# Patient Record
Sex: Male | Born: 1985 | Race: Black or African American | Hispanic: No | Marital: Single | Smoking: Never smoker
Health system: Southern US, Community
[De-identification: ages and names within clinical notes are randomized; demographics above are authoritative.]

## PROBLEM LIST (undated history)

## (undated) DIAGNOSIS — G809 Cerebral palsy, unspecified: Secondary | ICD-10-CM

## (undated) HISTORY — PX: DG CALCANEOUS HEEL LEFT (ARMC HX): HXRAD1465

## (undated) HISTORY — PX: HAND SURGERY: SHX662

## (undated) HISTORY — PX: SALIVARY GLAND SURGERY: SHX768

---

## 2010-04-28 ENCOUNTER — Emergency Department (HOSPITAL_COMMUNITY)
Admission: EM | Admit: 2010-04-28 | Discharge: 2010-04-28 | Disposition: A | Discharge status: Home / Self Care | Payer: Medicaid Other | Attending: Emergency Medicine | Admitting: Emergency Medicine

## 2010-04-28 DIAGNOSIS — G809 Cerebral palsy, unspecified: Secondary | ICD-10-CM | POA: Insufficient documentation

## 2010-04-28 DIAGNOSIS — L298 Other pruritus: Secondary | ICD-10-CM | POA: Insufficient documentation

## 2010-04-28 DIAGNOSIS — L2989 Other pruritus: Secondary | ICD-10-CM | POA: Insufficient documentation

## 2010-04-28 DIAGNOSIS — R21 Rash and other nonspecific skin eruption: Secondary | ICD-10-CM | POA: Insufficient documentation

## 2010-04-28 DIAGNOSIS — T7840XA Allergy, unspecified, initial encounter: Secondary | ICD-10-CM | POA: Insufficient documentation

## 2010-04-28 DIAGNOSIS — L509 Urticaria, unspecified: Secondary | ICD-10-CM | POA: Insufficient documentation

## 2010-04-28 DIAGNOSIS — I10 Essential (primary) hypertension: Secondary | ICD-10-CM | POA: Insufficient documentation

## 2011-08-22 ENCOUNTER — Other Ambulatory Visit: Payer: Self-pay | Admitting: Specialist

## 2011-08-22 DIAGNOSIS — R110 Nausea: Secondary | ICD-10-CM

## 2011-08-22 DIAGNOSIS — R1011 Right upper quadrant pain: Secondary | ICD-10-CM

## 2011-08-25 ENCOUNTER — Ambulatory Visit
Admission: RE | Admit: 2011-08-25 | Discharge: 2011-08-25 | Disposition: A | Discharge status: Home / Self Care | Payer: Medicaid Other | Source: Ambulatory Visit | Attending: Specialist | Admitting: Specialist

## 2011-08-25 DIAGNOSIS — R110 Nausea: Secondary | ICD-10-CM

## 2011-08-25 DIAGNOSIS — R1011 Right upper quadrant pain: Secondary | ICD-10-CM

## 2014-06-05 ENCOUNTER — Emergency Department (HOSPITAL_COMMUNITY)
Admission: EM | Admit: 2014-06-05 | Discharge: 2014-06-05 | Disposition: A | Discharge status: Home / Self Care | Payer: Medicaid Other | Source: Home / Self Care

## 2014-06-05 ENCOUNTER — Encounter (HOSPITAL_COMMUNITY): Payer: Self-pay | Admitting: Emergency Medicine

## 2014-06-05 DIAGNOSIS — G809 Cerebral palsy, unspecified: Secondary | ICD-10-CM

## 2014-06-05 DIAGNOSIS — J111 Influenza due to unidentified influenza virus with other respiratory manifestations: Secondary | ICD-10-CM

## 2014-06-05 DIAGNOSIS — R05 Cough: Secondary | ICD-10-CM

## 2014-06-05 DIAGNOSIS — R059 Cough, unspecified: Secondary | ICD-10-CM

## 2014-06-05 HISTORY — DX: Cerebral palsy, unspecified: G80.9

## 2014-06-05 MED ORDER — BENZONATATE 100 MG PO CAPS: 100.0000 mg | ORAL_CAPSULE | Freq: Three times a day (TID) | ORAL | Status: DC | PRN

## 2014-06-05 NOTE — ED Notes (Signed)
C/o cold sx onset 3 days Sx include HA, chills, fevers and diarrhea Taking Sudafed w/no relief Alert, no signs of acute distress.

## 2014-06-05 NOTE — Discharge Instructions (Signed)
You have the flu. This is improving. Please use the tessalon for your cough Please use mucinex for the mucus. Please come back if you get worse.

## 2014-06-05 NOTE — ED Provider Notes (Signed)
CSN: [id number]     Arrival date & time 06/05/14  1045 History   None    Chief Complaint  Patient presents with  . URI   (Consider location/radiation/quality/duration/timing/severity/associated sxs/prior Treatment) HPI   3 days ago developed HA, cough. Sudafed w/o benefit. Assocated w/ body aches chills, subjective fevers, diarrhea. Symptoms are constant but improving overall. Developed green mucus yesterday. Denies abd pain, nausea, back pain, dysuria, AMS, syncope. Worse at night.   Past Medical History  Diagnosis Date  . Cerebral palsy    History reviewed. No pertinent past surgical history. Family History  Problem Relation Age of Onset  . Cancer Neg Hx   . Hypertension Neg Hx   . Hyperlipidemia Neg Hx   . Heart failure Neg Hx    History  Substance Use Topics  . Smoking status: Never Smoker   . Smokeless tobacco: Not on file  . Alcohol Use: No    Review of Systems Per HPI with all other pertinent systems negative.   Allergies  Review of patient's allergies indicates no known allergies.  Home Medications   Prior to Admission medications   Medication Sig Start Date End Date Taking? Authorizing Provider  benzonatate (TESSALON PERLES) 100 MG capsule Take 1-2 capsules (100-200 mg total) by mouth 3 (three) times daily as needed for cough. 06/05/14   Waldemar Dickens, MD   BP 113/66 mmHg  Pulse 84  Temp(Src) 98.5 F (36.9 C) (Oral)  Resp 14  SpO2 97% Physical Exam Physical Exam  Constitutional: oriented to person, place, and time. appears well-developed and well-nourished. No distress.  HENT:  Head: Normocephalic and atraumatic.  Eyes: EOMI. PERRL.  Neck: Normal range of motion.  Cardiovascular: RRR, no m/r/g, 2+ distal pulses,  Pulmonary/Chest: Effort normal and breath sounds normal. No respiratory distress.  Abdominal: Soft. Bowel sounds are normal. NonTTP, no distension.  Musculoskeletal: Normal range of motion. Non ttp, no effusion.  Neurological: alert and  oriented to person, place, and time.  Skin: Skin is warm. No rash noted. non diaphoretic.  Psychiatric: normal mood and affect. behavior is normal. Judgment and thought content normal.   ED Course  Procedures (including critical care time) Labs Review Labs Reviewed - No data to display  Imaging Review No results found.   MDM   1. Flu   2. Cough   3. Cerebral palsy    Flu symptoms improving. Continue IV Profen and Tylenol as needed for symptom relief. No signs of dehydration or respiratory decompensation. Patient with cerebral palsy but very high functioning. Patient to start Mucinex for mucus secretions and cough. Start Tessalon Perles for lingering nagging cough. No need for antibiotics at this point in time and patient outside the window for Tamiflu.   Precautions given and all questions answered  Linna Darner, MD Family Medicine 06/05/2014, 12:02 PM      Waldemar Dickens, MD 06/05/14 [phone]

## 2015-05-14 ENCOUNTER — Emergency Department (HOSPITAL_COMMUNITY): Payer: Medicaid Other

## 2015-05-14 ENCOUNTER — Emergency Department (HOSPITAL_COMMUNITY)
Admission: EM | Admit: 2015-05-14 | Discharge: 2015-05-15 | Disposition: A | Discharge status: Home / Self Care | Payer: Medicaid Other | Attending: Emergency Medicine | Admitting: Emergency Medicine

## 2015-05-14 ENCOUNTER — Encounter (HOSPITAL_COMMUNITY): Payer: Self-pay | Admitting: *Deleted

## 2015-05-14 DIAGNOSIS — Z8669 Personal history of other diseases of the nervous system and sense organs: Secondary | ICD-10-CM | POA: Diagnosis not present

## 2015-05-14 DIAGNOSIS — R109 Unspecified abdominal pain: Secondary | ICD-10-CM | POA: Insufficient documentation

## 2015-05-14 LAB — URINALYSIS, ROUTINE W REFLEX MICROSCOPIC
BILIRUBIN URINE: NEGATIVE
Glucose, UA: NEGATIVE mg/dL
Hgb urine dipstick: NEGATIVE
KETONES UR: NEGATIVE mg/dL
LEUKOCYTES UA: NEGATIVE
NITRITE: NEGATIVE
PH: 5.5 (ref 5.0–8.0)
PROTEIN: NEGATIVE mg/dL
Specific Gravity, Urine: 1.021 (ref 1.005–1.030)

## 2015-05-14 MED ORDER — ONDANSETRON HCL 4 MG/2ML IJ SOLN
4.0000 mg | Freq: Once | INTRAMUSCULAR | Status: AC
  Administered 2015-05-15: 4 mg via INTRAVENOUS
  Filled 2015-05-14: qty 2

## 2015-05-14 MED ORDER — MORPHINE SULFATE (PF) 4 MG/ML IV SOLN
4.0000 mg | Freq: Once | INTRAVENOUS | Status: AC
  Administered 2015-05-15: 4 mg via INTRAVENOUS
  Filled 2015-05-14: qty 1

## 2015-05-14 NOTE — ED Notes (Signed)
Pt c/o right side pain x 2 hours. Denies nausea, denies urinary symptoms.

## 2015-05-14 NOTE — ED Notes (Signed)
Pt states he took some ibuprofen at 8p which helped.

## 2015-05-14 NOTE — ED Notes (Signed)
Pt called for triage but no answer in lobby

## 2015-05-14 NOTE — ED Provider Notes (Signed)
CSN: [id number]     Arrival date & time 05/14/15  2158 History   First MD Initiated Contact with Patient 05/14/15 2329     Chief Complaint  Patient presents with  . Flank Pain     (Consider location/radiation/quality/duration/timing/severity/associated sxs/prior Treatment) HPI Comments: Patient presents to the emergency department with chief complaint of right flank pain. He states the pain came on suddenly this evening. States that it was sharp in nature. States that the pain was severe, but it has eased off now after taking some ibuprofen. He denies any dysuria. Denies any trauma or injury. He denies fevers or cough. There are no other modifying factors.  The history is provided by the patient. No language interpreter was used.    Past Medical History  Diagnosis Date  . Cerebral palsy ([place])    History reviewed. No pertinent past surgical history. Family History  Problem Relation Age of Onset  . Cancer Neg Hx   . Hypertension Neg Hx   . Hyperlipidemia Neg Hx   . Heart failure Neg Hx    Social History  Substance Use Topics  . Smoking status: Never Smoker   . Smokeless tobacco: None  . Alcohol Use: No    Review of Systems  All other systems reviewed and are negative.     Allergies  Review of patient's allergies indicates no known allergies.  Home Medications   Prior to Admission medications   Medication Sig Start Date End Date Taking? Authorizing Provider  benzonatate (TESSALON PERLES) 100 MG capsule Take 1-2 capsules (100-200 mg total) by mouth 3 (three) times daily as needed for cough. 06/05/14   Waldemar Dickens, MD   BP 158/101 mmHg  Pulse 69  Temp(Src) 97.4 F (36.3 C)  Resp 18  SpO2 98% Physical Exam  Constitutional: He is oriented to person, place, and time. He appears well-developed and well-nourished.  HENT:  Head: Normocephalic and atraumatic.  Eyes: Conjunctivae and EOM are normal. Pupils are equal, round, and reactive to light. Right eye exhibits no  discharge. Left eye exhibits no discharge. No scleral icterus.  Neck: Normal range of motion. Neck supple. No JVD present.  Cardiovascular: Normal rate, regular rhythm and normal heart sounds.  Exam reveals no gallop and no friction rub.   No murmur heard. Pulmonary/Chest: Effort normal and breath sounds normal. No respiratory distress. He has no wheezes. He has no rales. He exhibits no tenderness.  Abdominal: Soft. He exhibits no distension and no mass. There is no tenderness. There is no rebound and no guarding.  No focal abdominal tenderness, no RLQ tenderness or pain at McBurney's point, no RUQ tenderness or Murphy's sign, no left-sided abdominal tenderness, no fluid wave, or signs of peritonitis   Musculoskeletal: Normal range of motion. He exhibits no edema or tenderness.  Neurological: He is alert and oriented to person, place, and time.  Skin: Skin is warm and dry.  Psychiatric: He has a normal mood and affect. His behavior is normal. Judgment and thought content normal.  Nursing note and vitals reviewed.   ED Course  Procedures (including critical care time) Results for orders placed or performed during the hospital encounter of 05/14/15  Urinalysis, Routine w reflex microscopic-may I&O cath if menses (not at [facility])  Result Value Ref Range   Color, Urine YELLOW YELLOW   APPearance CLEAR CLEAR   Specific Gravity, Urine 1.021 1.005 - 1.030   pH 5.5 5.0 - 8.0   Glucose, UA NEGATIVE NEGATIVE mg/dL   Hgb  urine dipstick NEGATIVE NEGATIVE   Bilirubin Urine NEGATIVE NEGATIVE   Ketones, ur NEGATIVE NEGATIVE mg/dL   Protein, ur NEGATIVE NEGATIVE mg/dL   Nitrite NEGATIVE NEGATIVE   Leukocytes, UA NEGATIVE NEGATIVE  CBC  Result Value Ref Range   WBC 5.2 4.0 - 10.5 K/uL   RBC 5.04 4.22 - 5.81 MIL/uL   Hemoglobin 14.4 13.0 - 17.0 g/dL   HCT 42.1 39.0 - 52.0 %   MCV 83.5 78.0 - 100.0 fL   MCH 28.6 26.0 - 34.0 pg   MCHC 34.2 30.0 - 36.0 g/dL   RDW 12.5 11.5 - 15.5 %   Platelets 187  150 - 400 K/uL  Comprehensive metabolic panel  Result Value Ref Range   Sodium 141 135 - 145 mmol/L   Potassium 3.8 3.5 - 5.1 mmol/L   Chloride 104 101 - 111 mmol/L   CO2 24 22 - 32 mmol/L   Glucose, Bld 96 65 - 99 mg/dL   BUN 9 6 - 20 mg/dL   Creatinine, Ser 1.03 0.61 - 1.24 mg/dL   Calcium 9.6 8.9 - 10.3 mg/dL   Total Protein 7.3 6.5 - 8.1 g/dL   Albumin 4.0 3.5 - 5.0 g/dL   AST 29 15 - 41 U/L   ALT 32 17 - 63 U/L   Alkaline Phosphatase 64 38 - 126 U/L   Total Bilirubin 0.8 0.3 - 1.2 mg/dL   GFR calc non Af Amer >60 >60 mL/min   GFR calc Af Amer >60 >60 mL/min   Anion gap 13 5 - 15   Ct Abdomen Pelvis Wo Contrast  05/15/2015  CLINICAL DATA:  RIGHT flank pain for 2 hours. History of cerebral palsy. EXAM: CT ABDOMEN AND PELVIS WITHOUT CONTRAST TECHNIQUE: Multidetector CT imaging of the abdomen and pelvis was performed following the standard protocol without IV contrast. COMPARISON:  Abdominal ultrasound August 25, 2011. FINDINGS: LUNG BASES: Included view of the lung bases are clear. The visualized heart and pericardium are unremarkable. KIDNEYS/BLADDER: Kidneys are orthotopic, demonstrating normal size and morphology. No nephrolithiasis, hydronephrosis; limited assessment for renal masses on this nonenhanced examination. The unopacified ureters are normal in course and caliber. Urinary bladder is partially distended and unremarkable. SOLID ORGANS: The liver, spleen, gallbladder, pancreas and adrenal glands are unremarkable for this non-contrast examination. GASTROINTESTINAL TRACT: Very small hiatal hernia. The stomach, small and large bowel are normal in course and caliber without inflammatory changes, the sensitivity may be decreased by lack of enteric contrast. Mild suspected sigmoid diverticulosis. Normal appendix. PERITONEUM/RETROPERITONEUM: Aortoiliac vessels are normal in course and caliber. No lymphadenopathy by CT size criteria. Small scattered lymph nodes are likely reactive. Prostate  size is normal. No intraperitoneal free fluid nor free air. SOFT TISSUES/ OSSEOUS STRUCTURES:  Nonsuspicious. IMPRESSION: No urolithiasis, obstructive uropathy nor acute intra-abdominal/pelvic process. Normal appendix. Electronically Signed   By: Elon Alas M.D.   On: 05/15/2015 00:37    I have personally reviewed and evaluated these images and lab results as part of my medical decision-making.    MDM   Final diagnoses:  Abdominal pain, unspecified abdominal location    Patient with moderate to severe right-sided flank pain. Will check CT for stone. We'll treat pain, and will reassess.  Labs and CT are normal. No further emergent workup indicated. Recommend primary care follow-up. Patient is stable and ready for discharge. Return precautions given.  Patient instructed to return for:  New or worsening symptoms, including, increased abdominal pain, especially pain that localizes to one side, bloody  vomit, bloody diarrhea, fever >101, and intractable vomiting.     Montine Circle, PA-C 05/15/15 0106  Leonard Schwartz, MD 05/17/15 1346

## 2015-05-15 LAB — CBC
HEMATOCRIT: 42.1 % (ref 39.0–52.0)
Hemoglobin: 14.4 g/dL (ref 13.0–17.0)
MCH: 28.6 pg (ref 26.0–34.0)
MCHC: 34.2 g/dL (ref 30.0–36.0)
MCV: 83.5 fL (ref 78.0–100.0)
PLATELETS: 187 10*3/uL (ref 150–400)
RBC: 5.04 MIL/uL (ref 4.22–5.81)
RDW: 12.5 % (ref 11.5–15.5)
WBC: 5.2 10*3/uL (ref 4.0–10.5)

## 2015-05-15 LAB — COMPREHENSIVE METABOLIC PANEL
ALBUMIN: 4 g/dL (ref 3.5–5.0)
ALT: 32 U/L (ref 17–63)
AST: 29 U/L (ref 15–41)
Alkaline Phosphatase: 64 U/L (ref 38–126)
Anion gap: 13 (ref 5–15)
BUN: 9 mg/dL (ref 6–20)
CHLORIDE: 104 mmol/L (ref 101–111)
CO2: 24 mmol/L (ref 22–32)
CREATININE: 1.03 mg/dL (ref 0.61–1.24)
Calcium: 9.6 mg/dL (ref 8.9–10.3)
GFR calc Af Amer: >60 mL/min (ref >60)
GLUCOSE: 96 mg/dL (ref 65–99)
Potassium: 3.8 mmol/L (ref 3.5–5.1)
Sodium: 141 mmol/L (ref 135–145)
Total Bilirubin: 0.8 mg/dL (ref 0.3–1.2)
Total Protein: 7.3 g/dL (ref 6.5–8.1)

## 2015-05-15 NOTE — Discharge Instructions (Signed)
Flank Pain Flank pain refers to pain that is located on the side of the body between the upper abdomen and the back. The pain may occur over a short period of time (acute) or may be long-term or reoccurring (chronic). It may be mild or severe. Flank pain can be caused by many things. CAUSES  Some of the more common causes of flank pain include:  Muscle strains.   Muscle spasms.   A disease of your spine (vertebral disk disease).   A lung infection (pneumonia).   Fluid around your lungs (pulmonary edema).   A kidney infection.   Kidney stones.   A very painful skin rash caused by the chickenpox virus (shingles).   Gallbladder disease.  [place] care will depend on the cause of your pain. In general,  Rest as directed by your caregiver.  Drink enough fluids to keep your urine clear or pale yellow.  Only take over-the-counter or prescription medicines as directed by your caregiver. Some medicines may help relieve the pain.  Tell your caregiver about any changes in your pain.  Follow up with your caregiver as directed. SEEK IMMEDIATE MEDICAL CARE IF:   Your pain is not controlled with medicine.   You have new or worsening symptoms.  Your pain increases.   You have abdominal pain.   You have shortness of breath.   You have persistent nausea or vomiting.   You have swelling in your abdomen.   You feel faint or pass out.   You have blood in your urine.  You have a fever or persistent symptoms for more than 2-3 days.  You have a fever and your symptoms suddenly get worse. MAKE SURE YOU:   Understand these instructions.  Will watch your condition.  Will get help right away if you are not doing well or get worse.   This information is not intended to replace advice given to you by your health care provider. Make sure you discuss any questions you have with your health care provider.   Document Released: 04/27/2005 Document  Revised: 11/29/2011 Document Reviewed: 10/19/2011 Elsevier Interactive Patient Education 2016 Elsevier Inc. Abdominal Pain, Adult Many things can cause abdominal pain. Usually, abdominal pain is not caused by a disease and will improve without treatment. It can often be observed and treated at home. Your health care provider will do a physical exam and possibly order blood tests and X-rays to help determine the seriousness of your pain. However, in many cases, more time must pass before a clear cause of the pain can be found. Before that point, your health care provider may not know if you need more testing or further treatment. HOME CARE INSTRUCTIONS Monitor your abdominal pain for any changes. The following actions may help to alleviate any discomfort you are experiencing:  Only take over-the-counter or prescription medicines as directed by your health care provider.  Do not take laxatives unless directed to do so by your health care provider.  Try a clear liquid diet (broth, tea, or water) as directed by your health care provider. Slowly move to a bland diet as tolerated. SEEK MEDICAL CARE IF:  You have unexplained abdominal pain.  You have abdominal pain associated with nausea or diarrhea.  You have pain when you urinate or have a bowel movement.  You experience abdominal pain that wakes you in the night.  You have abdominal pain that is worsened or improved by eating food.  You have abdominal pain that  is worsened with eating fatty foods.  You have a fever. SEEK IMMEDIATE MEDICAL CARE IF:  Your pain does not go away within 2 hours.  You keep throwing up (vomiting).  Your pain is felt only in portions of the abdomen, such as the right side or the left lower portion of the abdomen.  You pass bloody or black tarry stools. MAKE SURE YOU:  Understand these instructions.  Will watch your condition.  Will get help right away if you are not doing well or get worse.   This  information is not intended to replace advice given to you by your health care provider. Make sure you discuss any questions you have with your health care provider.   Document Released: 12/14/2004 Document Revised: 11/25/2014 Document Reviewed: 11/13/2012 Elsevier Interactive Patient Education [employer].

## 2015-05-15 NOTE — ED Notes (Signed)
MD at bedside. 

## 2015-05-15 NOTE — ED Notes (Signed)
Pt departed in NAD.  

## 2016-09-30 IMAGING — CT CT ABD-PELV W/O CM
2 of 4 series · 10 of 46 positions shown, 11 images · non-contrast
Comparison: Abdominal ultrasound August 25, 2011.

CLINICAL DATA: RIGHT flank pain for 2 hours. History of cerebral
palsy.

EXAM:
CT ABDOMEN AND PELVIS WITHOUT CONTRAST
TECHNIQUE: Multidetector CT imaging of the abdomen and pelvis was performed
following the standard protocol without IV contrast.

[Series 201: stone study, idose (2) · axial · 0.84mm/px · z∈[+44,+429]mm · 7 of 93 slices shown, 8 images]
[im 8/93  soft-tissue]
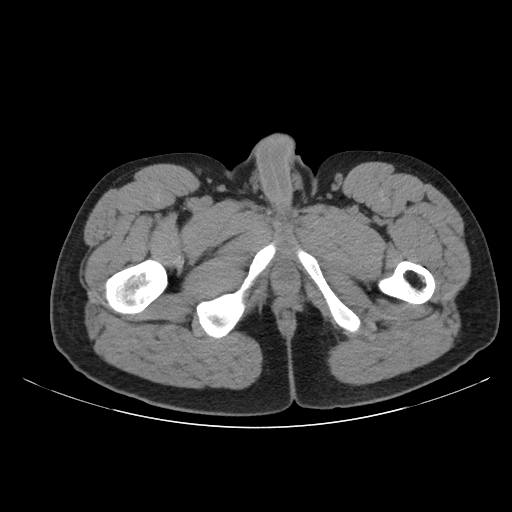
[im 8/93  bone]
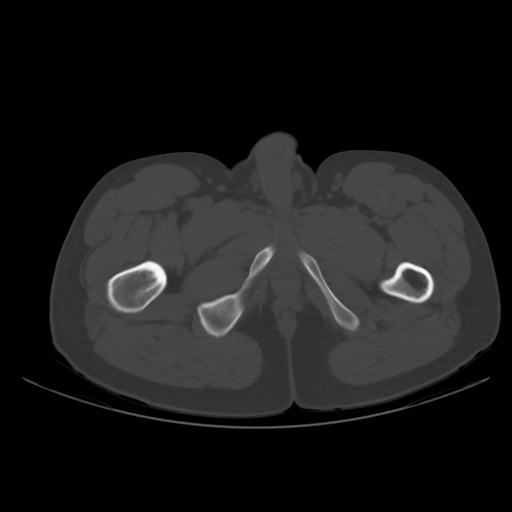
[im 20/93  soft-tissue]
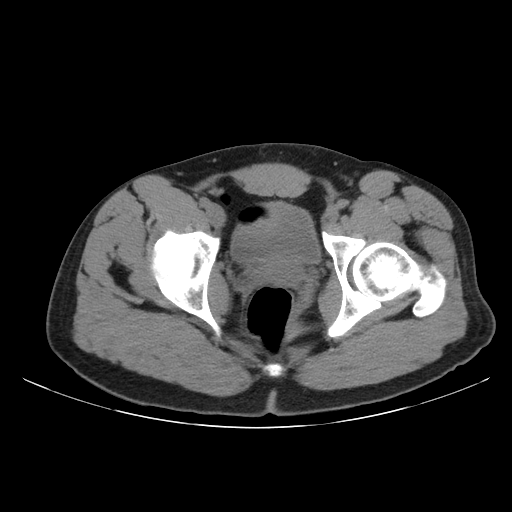
[im 35/93  soft-tissue]
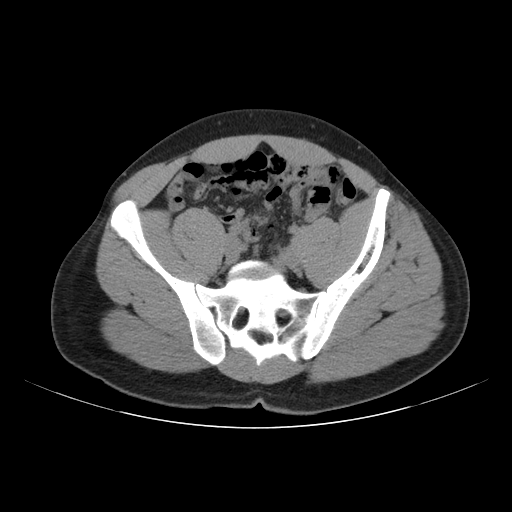
[im 47/93  soft-tissue]
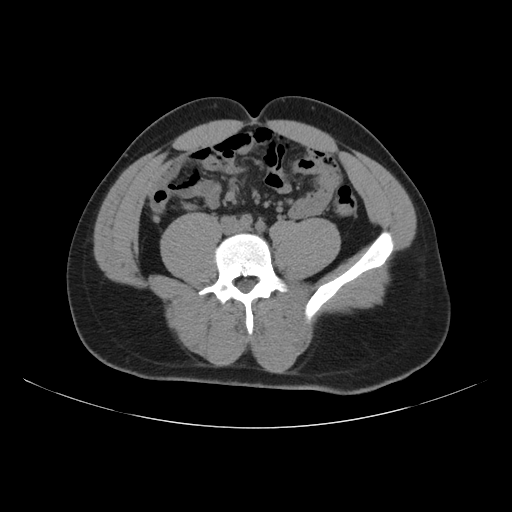
[im 58/93  soft-tissue]
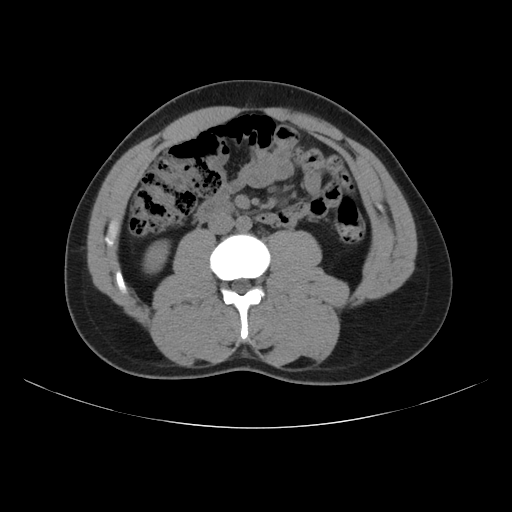
[im 73/93  soft-tissue]
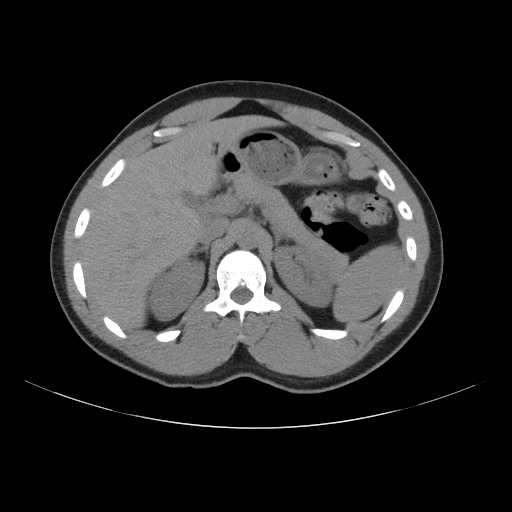
[im 85/93  soft-tissue]
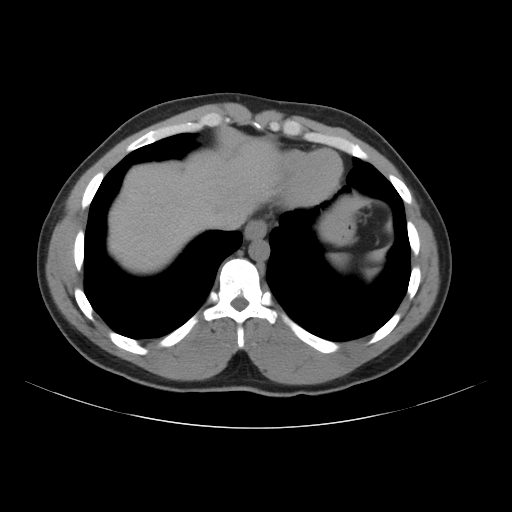

[Series 203: coronals, idose (2) · coronal · 0.45mm/px · 3 of 121 slices shown]
[im 41/121  soft-tissue]
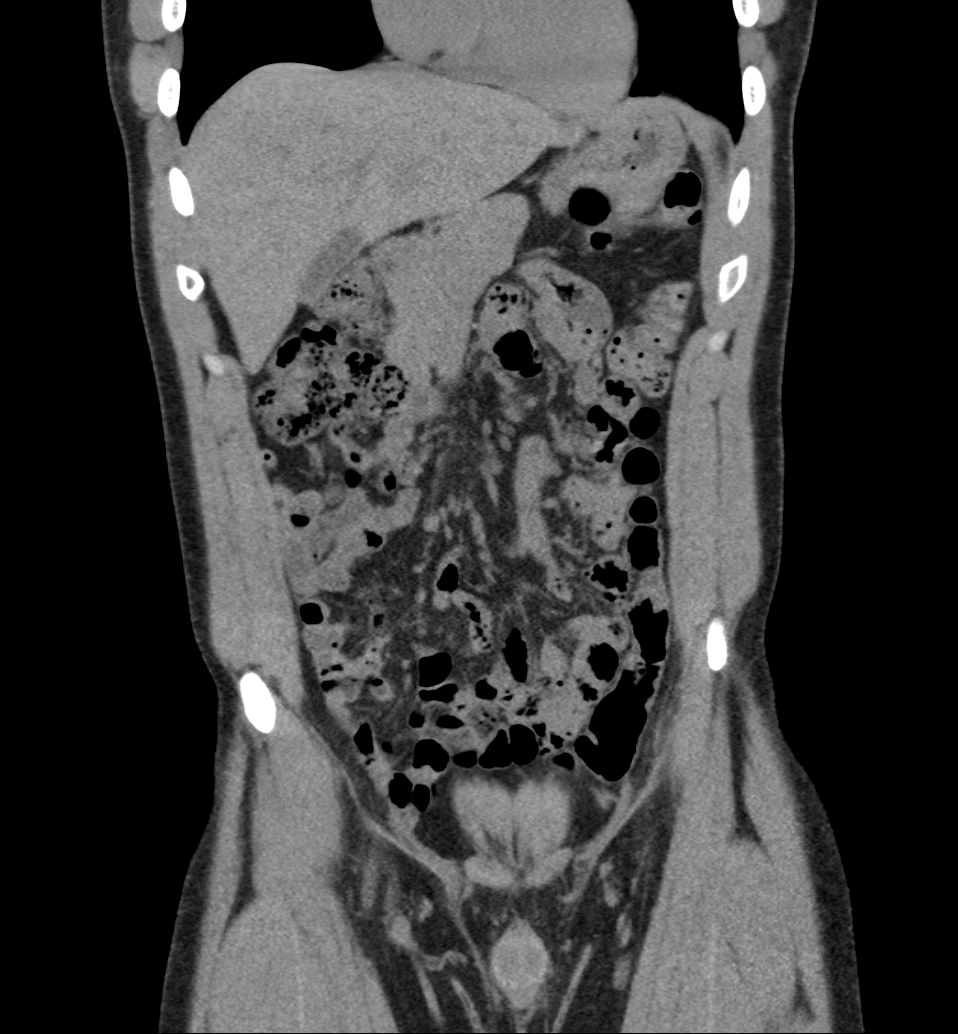
[im 54/121  soft-tissue]
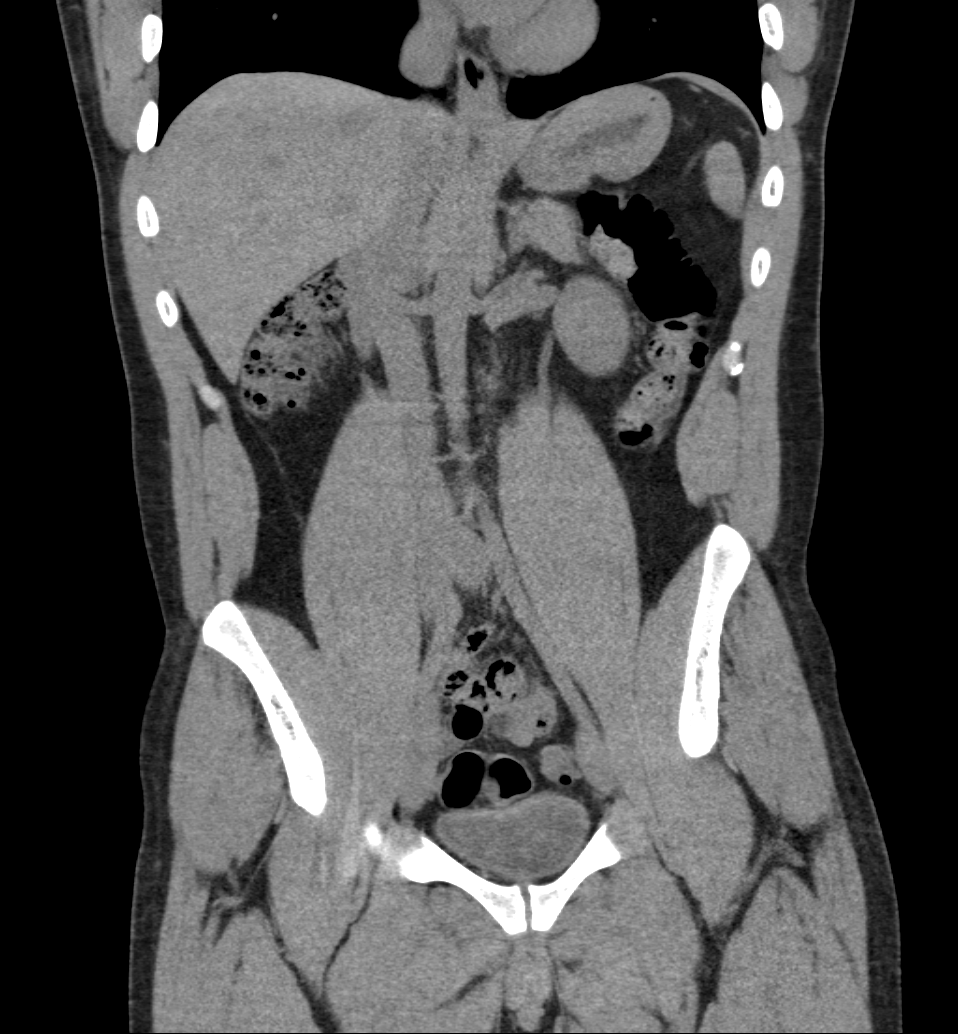
[im 67/121  soft-tissue]
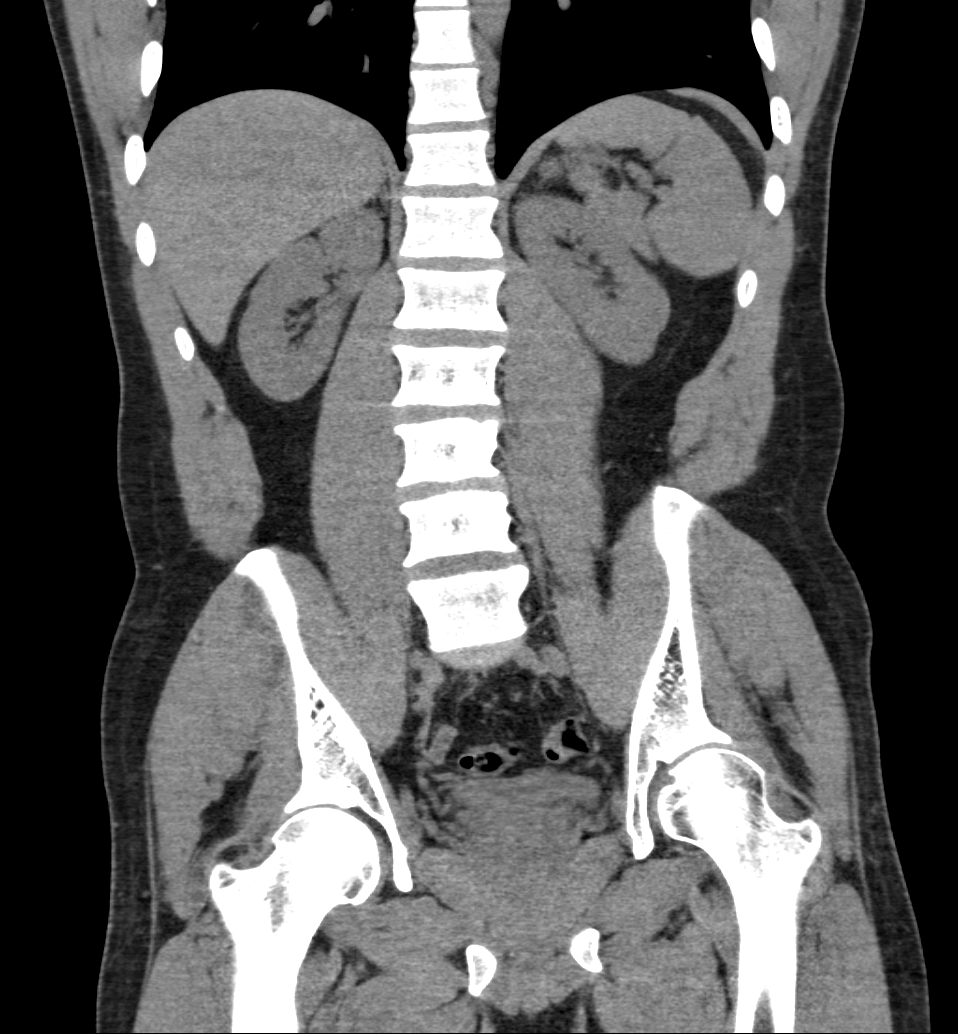

[10 of 46 positions shown; findings below may reference images not displayed]

FINDINGS: LUNG BASES: Included view of the lung bases are clear. The
visualized heart and pericardium are unremarkable.

KIDNEYS/BLADDER: Kidneys are orthotopic, demonstrating normal size
and morphology. No nephrolithiasis, hydronephrosis; limited
assessment for renal masses on this nonenhanced examination. The
unopacified ureters are normal in course and caliber. Urinary
bladder is partially distended and unremarkable.

SOLID ORGANS: The liver, spleen, gallbladder, pancreas and adrenal
glands are unremarkable for this non-contrast examination.

GASTROINTESTINAL TRACT: Very small hiatal hernia. The stomach, small
and large bowel are normal in course and caliber without
inflammatory changes, the sensitivity may be decreased by lack of
enteric contrast. Mild suspected sigmoid diverticulosis. Normal
appendix.

PERITONEUM/RETROPERITONEUM: Aortoiliac vessels are normal in course
and caliber. No lymphadenopathy by CT size criteria. Small scattered
lymph nodes are likely reactive. Prostate size is normal. No
intraperitoneal free fluid nor free air.

SOFT TISSUES/ OSSEOUS STRUCTURES:  Nonsuspicious.
IMPRESSION: No urolithiasis, obstructive uropathy nor acute
intra-abdominal/pelvic process. Normal appendix.

## 2020-04-08 ENCOUNTER — Other Ambulatory Visit: Payer: Self-pay

## 2020-04-08 ENCOUNTER — Ambulatory Visit (INDEPENDENT_AMBULATORY_CARE_PROVIDER_SITE_OTHER): Payer: Medicaid Other | Admitting: Nurse Practitioner

## 2020-04-08 ENCOUNTER — Encounter: Payer: Self-pay | Admitting: Nurse Practitioner

## 2020-04-08 VITALS — BP 117/78 | HR 97 | Temp 97.2°F | Ht 73.0 in | Wt 213.0 lb

## 2020-04-08 DIAGNOSIS — G809 Cerebral palsy, unspecified: Secondary | ICD-10-CM

## 2020-04-08 DIAGNOSIS — Z7689 Persons encountering health services in other specified circumstances: Secondary | ICD-10-CM

## 2020-04-08 LAB — POCT URINALYSIS DIPSTICK
Bilirubin, UA: NEGATIVE
Blood, UA: NEGATIVE
Glucose, UA: NEGATIVE
Leukocytes, UA: NEGATIVE
Nitrite, UA: NEGATIVE
Protein, UA: POSITIVE — AB
Spec Grav, UA: >=1.03 — AB (ref 1.010–1.025)
Urobilinogen, UA: 1 E.U./dL
pH, UA: 5.5 (ref 5.0–8.0)

## 2020-04-08 NOTE — Patient Instructions (Signed)

## 2020-04-08 NOTE — Progress Notes (Signed)
[place], [place]  [address] Phone:  [phone]   Fax:  [phone]   New Patient Office Visit  Subjective:  Patient ID: Gregory Brock, male    DOB: June 07, 1985  Age: 35 y.o. MRN: [id number]  CC:  Chief Complaint  Patient presents with  . New Patient (Initial Visit)    New patient , est care     HPI Braylan Faul presents to establish care. He denies any concerns today. He  has a past medical history of Cerebral palsy ([place]).  He also underwent surgery on his throat in 2008. 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   Past Medical History:  Diagnosis Date  . Cerebral palsy [facility])     Past Surgical History:  Procedure Laterality Date  . ANKLE SURGERY    . HAND SURGERY      Family History  Problem Relation Age of Onset  . Cancer Neg Hx   . Hypertension Neg Hx   . Hyperlipidemia Neg Hx   . Heart failure Neg Hx     Social History   Socioeconomic History  . Marital status: Single    Spouse name: Not on file  . Number of children: Not on file  . Years of education: Not on file  . Highest education level: Not on file  Occupational History  . Not on file  Tobacco Use  . Smoking status: Never Smoker  . Smokeless tobacco: Never Used  Substance and Sexual Activity  . Alcohol use: No  . Drug use: No  . Sexual activity: Not on file  Other Topics Concern  . Not on file  Social History Narrative  . Not on file   Social Determinants of Health   Financial Resource Strain: Not on file  Food Insecurity: Not on file  Transportation Needs: Not on file  Physical Activity: Not on file  Stress: Not on file  Social Connections: Not on file  Intimate Partner Violence: Not on file    ROS Review of Systems  Constitutional: Negative for chills and fever.  HENT: Negative.   Eyes: Negative.   Respiratory: Negative.   Cardiovascular: Negative.   Gastrointestinal: Negative for constipation, diarrhea, nausea and  vomiting.  Endocrine: Negative.   Genitourinary: Negative.  Negative for difficulty urinating, dysuria and frequency.  Musculoskeletal: Positive for joint swelling (righ ankle).  Skin: Negative.   Allergic/Immunologic: Positive for environmental allergies (pollen).  Neurological: Negative for dizziness and headaches.  Hematological: Negative.   Psychiatric/Behavioral: Negative.     Objective:   Today's Vitals: BP 117/78 (BP Location: Right Arm, Patient Position: Sitting, Cuff Size: Large)   Pulse 97   Temp (!) 97.2 F (36.2 C) (Temporal)   Ht 6\' 1"  (1.854 m)   Wt 213 lb (96.6 kg)   SpO2 97%   BMI 28.10 kg/m   Physical Exam HENT:     Head: Normocephalic and atraumatic.     Right Ear: Tympanic membrane normal.     Left Ear: Tympanic membrane normal.     Nose: Nose normal.     Mouth/Throat:     Mouth: Mucous membranes are moist.  Cardiovascular:     Rate and Rhythm: Normal rate.     Pulses: Normal pulses.     Heart sounds: Normal heart sounds.  Pulmonary:     Effort: Pulmonary effort is normal.     Breath sounds: Normal breath sounds.  Abdominal:  Palpations: Abdomen is soft.  Musculoskeletal:        General: Normal range of motion.     Cervical back: Normal range of motion.  Skin:    General: Skin is warm and dry.     Capillary Refill: Capillary refill takes less than 2 seconds.  Neurological:     General: No focal deficit present.     Mental Status: He is alert and oriented to person, place, and time.  Psychiatric:        Mood and Affect: Mood normal.        Behavior: Behavior normal.        Thought Content: Thought content normal.        Judgment: Judgment normal.     Assessment & Plan:   Problem List Items Addressed This Visit   None   Visit Diagnoses    Encounter to establish care    -  Primary Discussed male health maintenance and safety   Cerebral palsy, unspecified type [facility])     Referral to orthopedics for completion of special health  maintenance       Outpatient Encounter Medications as of 04/08/2020  Medication Sig  . Bee Pollen 1000 MG TABS Take by mouth.  . Black Currant Seed Oil 500 MG CAPS Take by mouth.  . Black Elderberry,Berry-Flower, 575 MG CAPS Take by mouth.  . cetirizine (ZYRTEC) 10 MG chewable tablet Chew by mouth.  . Cholecalciferol 50 MCG (2000 UT) TABS Take by mouth.  . naproxen (NAPROSYN) 500 MG tablet Take 500 mg by mouth 2 (two) times daily.  Marland Kitchen zinc gluconate 50 MG tablet Take by mouth.  . [DISCONTINUED] benzonatate (TESSALON PERLES) 100 MG capsule Take 1-2 capsules (100-200 mg total) by mouth 3 (three) times daily as needed for cough. (Patient not taking: Reported on 04/08/2020)   No facility-administered encounter medications on file as of 04/08/2020.    Follow-up: Return in about 1 year (around 04/08/2021) for [place] [[id number]].   Vevelyn Francois, NP

## 2020-04-08 NOTE — Addendum Note (Signed)
Addended by: Tyrone Apple on: 04/08/2020 04:12 PM   Modules accepted: Orders

## 2020-04-13 ENCOUNTER — Other Ambulatory Visit: Payer: Self-pay

## 2020-04-13 ENCOUNTER — Encounter: Payer: Self-pay | Admitting: Family Medicine

## 2020-04-13 ENCOUNTER — Ambulatory Visit (INDEPENDENT_AMBULATORY_CARE_PROVIDER_SITE_OTHER): Payer: Medicaid Other | Admitting: Family Medicine

## 2020-04-13 ENCOUNTER — Ambulatory Visit: Payer: Medicaid Other | Admitting: Family Medicine

## 2020-04-13 DIAGNOSIS — G802 Spastic hemiplegic cerebral palsy: Secondary | ICD-10-CM | POA: Diagnosis not present

## 2020-04-13 NOTE — Progress Notes (Signed)
   Office Visit Note   Patient: Gregory Brock           Date of Birth: 03-Nov-1985           MRN: [id number] Visit Date: 04/13/2020 Requested by: Vevelyn Francois, NP [address] [place],   [address] PCP: No primary care provider on file.  Subjective: Chief Complaint  Patient presents with  . 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  He has cerebral palsy.  He has never had an accident and is able to drive a regular vehicle without any modifications.  He walks without an assistive device.  His cerebral palsy affects his left arm and left leg.               ROS:   All other systems were reviewed and are negative.  Objective: Vital Signs: There were no vitals taken for this visit.  Physical Exam:  General:  Alert and oriented, in no acute distress. Pulm:  Breathing unlabored. Psy:  Normal mood, congruent affect.  Left arm: He has full flexion and extension at the elbow but he has limited supination and pronation of the forearm.  He has limited use of his left thumb.  His grip strength with his fingers his 4+/5 compared to his right hand.  Upper extremity reflexes are 2+. Left leg: There is muscular atrophy of his quadriceps compared to the right.  He has good strength with knee extension and knee flexion, hyperreflexic patella and Achilles.  No clonus. Dr. Durward Fortes examined the patient as well.  Imaging: No results found.  Assessment & Plan: 1.  Cerebral palsy with hemiplegia -Paperwork completed for CDL application.  He will undergo testing by the SPE examiner to determine whether he can drive the vehicle.  Follow-up as needed.     Procedures: No procedures performed        PMFS History: There are no problems to display for this patient.  Past Medical History:  Diagnosis Date  . Cerebral palsy (HCC)     Family History  Problem Relation Age  of Onset  . Cancer Neg Hx   . Hypertension Neg Hx   . Hyperlipidemia Neg Hx   . Heart failure Neg Hx     Past Surgical History:  Procedure Laterality Date  . ANKLE SURGERY    . HAND SURGERY     Social History   Occupational History  . Not on file  Tobacco Use  . Smoking status: Never Smoker  . Smokeless tobacco: Never Used  Substance and Sexual Activity  . Alcohol use: No  . Drug use: No  . Sexual activity: Not on file

## 2020-06-25 ENCOUNTER — Other Ambulatory Visit (INDEPENDENT_AMBULATORY_CARE_PROVIDER_SITE_OTHER): Payer: Medicaid Other

## 2020-06-25 ENCOUNTER — Encounter: Payer: Self-pay | Admitting: Gastroenterology

## 2020-06-25 ENCOUNTER — Ambulatory Visit (INDEPENDENT_AMBULATORY_CARE_PROVIDER_SITE_OTHER): Payer: Medicaid Other | Admitting: Gastroenterology

## 2020-06-25 ENCOUNTER — Other Ambulatory Visit: Payer: Self-pay

## 2020-06-25 VITALS — BP 120/86 | HR 76 | Ht 72.25 in | Wt 212.4 lb

## 2020-06-25 DIAGNOSIS — R143 Flatulence: Secondary | ICD-10-CM

## 2020-06-25 DIAGNOSIS — R159 Full incontinence of feces: Secondary | ICD-10-CM

## 2020-06-25 DIAGNOSIS — R151 Fecal smearing: Secondary | ICD-10-CM

## 2020-06-25 LAB — BASIC METABOLIC PANEL
BUN: 10 mg/dL (ref 6–23)
CO2: 30 mEq/L (ref 19–32)
Calcium: 9.7 mg/dL (ref 8.4–10.5)
Chloride: 103 mEq/L (ref 96–112)
Creatinine, Ser: 0.98 mg/dL (ref 0.40–1.50)
GFR: 100.36 mL/min (ref >60.00)
Glucose, Bld: 82 mg/dL (ref 70–99)
Potassium: 3.6 mEq/L (ref 3.5–5.1)
Sodium: 138 mEq/L (ref 135–145)

## 2020-06-25 LAB — SEDIMENTATION RATE: Sed Rate: 13 mm/hr (ref 0–15)

## 2020-06-25 LAB — CBC
HCT: 41.9 % (ref 39.0–52.0)
Hemoglobin: 14 g/dL (ref 13.0–17.0)
MCHC: 33.4 g/dL (ref 30.0–36.0)
MCV: 85.6 fl (ref 78.0–100.0)
Platelets: 216 10*3/uL (ref 150.0–400.0)
RBC: 4.89 Mil/uL (ref 4.22–5.81)
RDW: 13.3 % (ref 11.5–15.5)
WBC: 5.6 10*3/uL (ref 4.0–10.5)

## 2020-06-25 LAB — C-REACTIVE PROTEIN: CRP: <1 mg/dL (ref 0.5–20.0)

## 2020-06-25 LAB — TSH: TSH: 0.99 u[IU]/mL (ref 0.35–4.50)

## 2020-06-25 MED ORDER — SUPREP BOWEL PREP KIT 17.5-3.13-1.6 GM/177ML PO SOLN: 1.0000 | ORAL | 0 refills | Status: DC

## 2020-06-25 NOTE — Patient Instructions (Addendum)
Your provider has requested that you go to the basement level for lab work before leaving today. Press "B" on the elevator. The lab is located at the first door on the left as you exit the elevator.  You have been scheduled for a colonoscopy. Please follow written instructions given to you at your visit today.  Please pick up your prep supplies at the pharmacy within the next 1-3 days. If you use inhalers (even only as needed), please bring them with you on the day of your procedure.   We have sent the following medications to your pharmacy for you to pick up at your convenience: Suprep   START :  A high fiber diet with plenty of fluids (up to 8 glasses of water daily) is suggested to relieve these symptoms.  Metamucil, 1 tablespoon once daily for 1 week , then increase to twice for 3 weeks.   OR   Fiber Choice -2 tablets once daily for 1 week, then increase to 2 tablets twice daily with plenty of fluids, at least 8 ounces of water.  If you are age 35 or older, your body mass index should be between 23-30. Your Body mass index is 28.6 kg/m. If this is out of the aforementioned range listed, please consider follow up with your Primary Care Provider.  Due to recent changes in healthcare laws, you may see the results of your imaging and laboratory studies on MyChart before your provider has had a chance to review them.  We understand that in some cases there may be results that are confusing or concerning to you. Not all laboratory results come back in the same time frame and the provider may be waiting for multiple results in order to interpret others.  Please give [place] 48 hours in order for your provider to thoroughly review all the results before contacting the office for clarification of your results.    Keep follow up appt on 08/03/20 @ 1:30pm  Thank you for choosing me and [facility].  Dr. Rush Landmark

## 2020-06-25 NOTE — Progress Notes (Signed)
GASTROENTEROLOGY OUTPATIENT CLINIC VISIT   Primary Care Provider Bernerd Limbo, MD [place] [address] [phone]  Referring Provider Bernerd Limbo, MD [place],  [place] [address] [phone]  Patient Profile: Gregory Brock is a 35 y.o. male with a pmh significant for cerebral palsy (spastic left side).  The patient presents to the [facility] for an evaluation and management of problem(s) noted below:  Problem List 1. Incontinence of feces, unspecified fecal incontinence type   2. Fecal smearing   3. Flatus     History of Present Illness This is the patient's first visit to the outpatient [place] clinic.  He is accompanied by his mother.  The patient is very well-functioning even with his history of cerebral palsy.  He maintains a work schedule.  He does live at home with his mother.  The patient and mother describe that for years there has been issues of fecal soilage.  The patient describes no episodes of true incontinence but in private, the patient's mother has significant concerns that there may be episodes that he does not describe or tell her about.  She takes care of the laundry and is seeing issues for quite a while now.  The patient describes having bowel movements once or twice daily.  For the most part they are Bristol scale 4-6.  He can have days where he does well per his report without any fecal smearing or soilage and then there are episodes that occur as well.  The patient has taken Metamucil sporadically in the past but never on a constant basis.  His mother has a history of constipation but no other GI issues are known.  He has never had an upper or lower endoscopy.  There is no anal receptive intercourse.  GI Review of Systems Positive as above Negative for pyrosis, dysphagia, odynophagia, nausea, vomiting, abdominal pain, bloating, melena, hematochezia  Review of  Systems General: Denies fevers/chills/weight loss unintentionally HEENT: Denies oral lesions Cardiovascular: Denies chest pain Pulmonary: Denies shortness of breath Gastroenterological: See HPI Genitourinary: Denies darkened urine Hematological: Denies easy bruising/bleeding Endocrine: Denies temperature intolerance Dermatological: Denies jaundice Psychological: Mood is anxious   Medications Current Outpatient Medications  Medication Sig Dispense Refill  . Bee Pollen 1000 MG TABS Take by mouth.    . Black Currant Seed Oil 500 MG CAPS Take by mouth.    . Black Elderberry,Berry-Flower, 575 MG CAPS Take by mouth.    . cetirizine (ZYRTEC) 10 MG chewable tablet Chew by mouth.    . Cholecalciferol 50 MCG (2000 UT) TABS Take by mouth.    . Na Sulfate-K Sulfate-Mg Sulf (SUPREP BOWEL PREP KIT) 17.5-3.13-1.6 GM/177ML SOLN Take 1 kit by mouth as directed. For colonoscopy prep 354 mL 0  . naproxen (NAPROSYN) 500 MG tablet Take 500 mg by mouth 2 (two) times daily.    Marland Kitchen zinc gluconate 50 MG tablet Take by mouth.     No current facility-administered medications for this visit.    Allergies No Known Allergies  Histories Past Medical History:  Diagnosis Date  . Cerebral palsy [facility])    Past Surgical History:  Procedure Laterality Date  . DG CALCANEOUS HEEL LEFT ([facility] HX) Left   . HAND SURGERY Left    thumb  . SALIVARY GLAND SURGERY     Social History   Socioeconomic History  . Marital status: Single    Spouse name: Not on file  . Number of children:  0  . Years of education: Not on file  . Highest education level: Not on file  Occupational History  . Occupation: [employer]  Tobacco Use  . Smoking status: Never Smoker  . Smokeless tobacco: Never Used  Vaping Use  . Vaping Use: Never used  Substance and Sexual Activity  . Alcohol use: No  . Drug use: No  . Sexual activity: Not on file  Other Topics Concern  . Not on file  Social History Narrative  . Not on file    Social Determinants of Health   Financial Resource Strain: Not on file  Food Insecurity: Not on file  Transportation Needs: Not on file  Physical Activity: Not on file  Stress: Not on file  Social Connections: Not on file  Intimate Partner Violence: Not on file   Family History  Problem Relation Age of Onset  . Hypertension Mother   . Hypertension Maternal Grandmother   . Hypertension Maternal Grandfather   . Prostate cancer Maternal Grandfather   . Heart attack Maternal Grandfather   . Cancer Neg Hx   . Hyperlipidemia Neg Hx   . Heart failure Neg Hx   . Colon cancer Neg Hx   . Esophageal cancer Neg Hx   . Inflammatory bowel disease Neg Hx   . Liver disease Neg Hx   . Pancreatic cancer Neg Hx   . Rectal cancer Neg Hx   . Stomach cancer Neg Hx    I have reviewed his medical, social, and family history in detail and updated the electronic medical record as necessary.    PHYSICAL EXAMINATION  BP 120/86 (BP Location: Left Arm, Patient Position: Sitting, Cuff Size: Normal)   Pulse 76   Ht 6' 0.25" (1.835 m) Comment: height measured without shoes  Wt 212 lb 6 oz (96.3 kg)   BMI 28.60 kg/m  Wt Readings from Last 3 Encounters:  06/25/20 212 lb 6 oz (96.3 kg)  04/08/20 213 lb (96.6 kg)  GEN: NAD, appears stated age, doesn't appear chronically ill, accompanied by mother PSYCH: Cooperative, without pressured speech EYE: Conjunctivae pink, sclerae anicteric ENT: Masked CV: Nontachycardic RESP: No audible wheezing GI: NABS, soft, NT/ND, without rebound or guarding, no HSM appreciated GU: Perianal exam shows no evidence of excoriations or rashes, DRE shows normal perineal descent without any palpable masses or lesions MSK/EXT: No lower extremity edema SKIN: No jaundice NEURO:  Alert & Oriented x 3   REVIEW OF DATA  I reviewed the following data at the time of this encounter:  GI Procedures and Studies  No relevant studies to review  Laboratory Studies  Reviewed  those in epic and care everywhere  Imaging Studies  No relevant studies to review   ASSESSMENT  [name] is a 35 y.o. male with a pmh significant for cerebral palsy (spastic left side).  The patient is seen today for evaluation and management of:  1. Incontinence of feces, unspecified fecal incontinence type   2. Fecal smearing   3. Flatus    The patient is hemodynamically stable.  Clinically however he has had years of issues of fecal smearing with possible incontinence although the patient defers actually having incontinence.  The etiology of his symptoms is not clear.  We are going to try to bulk his stool significantly in an effort of trying to optimize how things pass.  If they do not improve then evaluation diagnostically with a colonoscopy to ensure that we do not have a any evidence of proctitis  or other disorders will be necessary.  Patient's digital rectal exam does not suggest that he has overt paradoxical movement but he may require anorectal manometry and/or pelvic floor biofeedback with think about this in the future.  The risks and benefits of endoscopic evaluation were discussed with the patient; these include but are not limited to the risk of perforation, infection, bleeding, missed lesions, lack of diagnosis, severe illness requiring hospitalization, as well as anesthesia and sedation related illnesses.  The patient is agreeable to proceed.  All patient questions were answered to the best of my ability, and the patient agrees to the aforementioned plan of action with follow-up as indicated.   PLAN  Laboratories as outlined below Metamucil once daily for 1 week and then increase to twice daily for next 3 to 4 weeks If patient still having significant issues then we will proceed with diagnostic colonoscopy We will consider anorectal manometry We will consider pelvic floor biofeedback   Orders Placed This Encounter  Procedures  . CBC  . Basic Metabolic Panel (BMET)  .  Calcium, ionized  . TSH  . Sedimentation rate  . C-reactive protein  . Tissue transglutaminase, IgA  . IgA  . Ambulatory referral to Gastroenterology    New Prescriptions   NA SULFATE-K SULFATE-MG SULF (SUPREP BOWEL PREP KIT) 17.5-3.13-1.6 GM/177ML SOLN    Take 1 kit by mouth as directed. For colonoscopy prep   Modified Medications   No medications on file    Planned Follow Up No follow-ups on file.   Total Time in Face-to-Face and in Coordination of Care for patient including independent/personal interpretation/review of prior testing, medical history, examination, medication adjustment, communicating results with the patient directly, and documentation with the EHR is 45 minutes.  Justice Britain, MD [facility] Office # [phone]

## 2020-06-26 LAB — CALCIUM, IONIZED: Calcium, Ion: 5.16 mg/dL (ref 4.8–5.6)

## 2020-06-28 ENCOUNTER — Encounter: Payer: Self-pay | Admitting: Gastroenterology

## 2020-06-28 DIAGNOSIS — R143 Flatulence: Secondary | ICD-10-CM | POA: Insufficient documentation

## 2020-06-28 DIAGNOSIS — R159 Full incontinence of feces: Secondary | ICD-10-CM | POA: Insufficient documentation

## 2020-06-28 DIAGNOSIS — R151 Fecal smearing: Secondary | ICD-10-CM | POA: Insufficient documentation

## 2020-06-28 LAB — IGA: Immunoglobulin A: 258 mg/dL (ref 47–310)

## 2020-06-28 LAB — TISSUE TRANSGLUTAMINASE, IGA: (tTG) Ab, IgA: <1 U/mL

## 2020-06-29 ENCOUNTER — Encounter: Payer: Self-pay | Admitting: Gastroenterology

## 2020-08-03 ENCOUNTER — Encounter: Payer: Self-pay | Admitting: Gastroenterology

## 2020-08-03 ENCOUNTER — Telehealth: Payer: Self-pay | Admitting: Gastroenterology

## 2020-08-03 ENCOUNTER — Ambulatory Visit: Payer: Medicaid Other | Admitting: Gastroenterology

## 2020-08-03 VITALS — BP 108/76 | HR 90 | Ht 72.25 in | Wt 216.2 lb

## 2020-08-03 DIAGNOSIS — R159 Full incontinence of feces: Secondary | ICD-10-CM | POA: Diagnosis not present

## 2020-08-03 DIAGNOSIS — R151 Fecal smearing: Secondary | ICD-10-CM | POA: Diagnosis not present

## 2020-08-03 NOTE — Progress Notes (Signed)
GASTROENTEROLOGY OUTPATIENT CLINIC VISIT   Primary Care Provider Bernerd Limbo, MD [place] [address] [phone]  Referring Provider No referring provider defined for this encounter.   Patient Profile: Gregory Brock is a 35 y.o. male with a pmh significant for cerebral palsy (spastic left side).  The patient presents to the [facility]enter[facility] for an evaluation and management of problem(s) noted below:  Problem List 1. Fecal smearing   2. Incontinence of feces, unspecified fecal incontinence type     History of Present Illness Please see initial consultation note for full details of HPI.  Interval History The patient returns for scheduled follow-up.  He is unaccompanied by his mother.  Interestingly, the patient's mother called and stated that she believes the patient is minimizing some of his symptoms and that he is still having fecal smearing and fecal incontinence.  When discussing with the patient his particular symptoms, he states that he is doing much better than he had been before.  He is taking Metamucil at least once if not 2 times daily.  This is helped him per his report to have less episodes of smearing.  He is happier about how his symptoms are improving.  The patient denies any blood in his stools.  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  Patient still amenable to consideration of interventions/evaluations to rule out eventually treatable other disorders.  GI Review of Systems Positive as above Negative for abdominal pain, dysphagia, odynophagia, nausea, vomiting, change in bowel habits   Review of Systems General: Denies fevers/chills/weight loss unintentionally Cardiovascular: Denies chest pain Pulmonary: Denies shortness of breath Gastroenterological: See HPI Genitourinary: Denies darkened urine Hematological: Denies easy bruising/bleeding Dermatological: Denies jaundice Psychological:  Mood is anxious   Medications Current Outpatient Medications  Medication Sig Dispense Refill  . Bee Pollen 1000 MG TABS Take by mouth.    . Black Currant Seed Oil 500 MG CAPS Take by mouth.    . Black Elderberry,Berry-Flower, 575 MG CAPS Take by mouth.    . cetirizine (ZYRTEC) 10 MG chewable tablet Chew by mouth.    . Cholecalciferol 50 MCG (2000 UT) TABS Take by mouth.    . Na Sulfate-K Sulfate-Mg Sulf (SUPREP BOWEL PREP KIT) 17.5-3.13-1.6 GM/177ML SOLN Take 1 kit by mouth as directed. For colonoscopy prep 354 mL 0  . naproxen (NAPROSYN) 500 MG tablet Take 500 mg by mouth 2 (two) times daily.    Marland Kitchen zinc gluconate 50 MG tablet Take by mouth.     No current facility-administered medications for this visit.    Allergies No Known Allergies  Histories Past Medical History:  Diagnosis Date  . Cerebral palsy [facility])    Past Surgical History:  Procedure Laterality Date  . DG CALCANEOUS HEEL LEFT ([facility] HX) Left   . HAND SURGERY Left    thumb  . SALIVARY GLAND SURGERY     Social History   Socioeconomic History  . Marital status: Single    Spouse name: Not on file  . Number of children: 0  . Years of education: Not on file  . Highest education level: Not on file  Occupational History  . Occupation: [employer]  Tobacco Use  . Smoking status: Never Smoker  . Smokeless tobacco: Never Used  Vaping Use  . Vaping Use: Never used  Substance and Sexual Activity  . Alcohol use: No  . Drug use: No  . Sexual activity: Not on file  Other Topics  Concern  . Not on file  Social History Narrative  . Not on file   Social Determinants of Health   Financial Resource Strain: Not on file  Food Insecurity: Not on file  Transportation Needs: Not on file  Physical Activity: Not on file  Stress: Not on file  Social Connections: Not on file  Intimate Partner Violence: Not on file   Family History  Problem Relation Age of Onset  . Hypertension Mother   . Hypertension Maternal  Grandmother   . Hypertension Maternal Grandfather   . Prostate cancer Maternal Grandfather   . Heart attack Maternal Grandfather   . Cancer Neg Hx   . Hyperlipidemia Neg Hx   . Heart failure Neg Hx   . Colon cancer Neg Hx   . Esophageal cancer Neg Hx   . Inflammatory bowel disease Neg Hx   . Liver disease Neg Hx   . Pancreatic cancer Neg Hx   . Rectal cancer Neg Hx   . Stomach cancer Neg Hx    I have reviewed his medical, social, and family history in detail and updated the electronic medical record as necessary.    PHYSICAL EXAMINATION  BP 108/76 (BP Location: Left Arm, Patient Position: Sitting, Cuff Size: Small)   Pulse 90   Ht 6' 0.25" (1.835 m)   Wt 216 lb 3.2 oz (98.1 kg)   SpO2 98%   BMI 29.12 kg/m  Wt Readings from Last 3 Encounters:  08/03/20 216 lb 3.2 oz (98.1 kg)  06/25/20 212 lb 6 oz (96.3 kg)  04/08/20 213 lb (96.6 kg)  GEN: NAD, appears stated age, doesn't appear chronically ill PSYCH: Cooperative, without pressured speech EYE: Conjunctivae pink, sclerae anicteric ENT: Masked CV: Nontachycardic RESP: No audible wheezing GI: NABS, soft, NT/ND, without rebound or guarding MSK/EXT: No lower extremity edema SKIN: No jaundice NEURO:  Alert & Oriented x 3   REVIEW OF DATA  I reviewed the following data at the time of this encounter:  GI Procedures and Studies  No relevant studies to review  Laboratory Studies  Reviewed those in epic and care everywhere  Imaging Studies  No relevant studies to review   ASSESSMENT  Mr. [name] is a 35 y.o. male with a pmh significant for cerebral palsy (spastic left side).  The patient is seen today for evaluation and management of:  1. Fecal smearing   2. Incontinence of feces, unspecified fecal incontinence type    The patient is hemodynamically stable.  Clinically, he describes improvement in his fecal smearing/possible incontinence by being on his bulking fiber.  The patient's mother had called and left a message  saying that she did not feel that significant changes had occurred but she was not present for today's visit.  Based on the patient's evaluation, I do think rule out of inflammatory bowel disease and proctitis is important.  Colonoscopy is already been scheduled and we will plan to proceed with the scheduled colonoscopy.  If it is unremarkable then consideration of anorectal manometry and/or barium defecography will be considered for this individual.  Pelvic floor biofeedback will be considered in the future as well if he continues to have issues.  As such I am going to go ahead and get him set up for an anorectal manometry a few weeks after his colonoscopy.  If you find that he has IBD or proctitis then we will cancel his anorectal manometry.  The risks and benefits of endoscopic evaluation were discussed with the patient; these include  but are not limited to the risk of perforation, infection, bleeding, missed lesions, lack of diagnosis, severe illness requiring hospitalization, as well as anesthesia and sedation related illnesses.  The patient is agreeable to proceed.  All patient questions were answered to the best of my ability, and the patient agrees to the aforementioned plan of action with follow-up as indicated.   PLAN  Continue Metamucil twice daily or may transition to Benefiber Proceed with diagnostic colonoscopy as he is already scheduled Proceed with scheduling anorectal manometry 2 weeks after colonoscopy Strongly considering anorectal manometry and MRI defecography We will consider pelvic floor biofeedback if issues persist   Orders Placed This Encounter  Procedures  . Procedural/ Surgical Case Request: ANO RECTAL MANOMETRY  . Ambulatory referral to Gastroenterology    New Prescriptions   No medications on file   Modified Medications   No medications on file    Planned Follow Up No follow-ups on file.   Total Time in Face-to-Face and in Coordination of Care for patient  including independent/personal interpretation/review of prior testing, medical history, examination, medication adjustment, communicating results with the patient directly, and documentation with the EHR is 25 minutes.  Justice Britain, MD [facility] Office # [phone]

## 2020-08-03 NOTE — Telephone Encounter (Signed)
Left message on machine to call back  

## 2020-08-03 NOTE — Patient Instructions (Addendum)
You have been scheduled to have an anorectal manometry at [facility] on 10/04/20 at 8:30am. Please arrive 30 minutes prior to your appointment time for registration (1st floor of the hospital-admissions).  Please make certain to use 1 Fleets enema at 6:30am( 2 hours prior) to coming for your appointment. You can purchase Fleets enemas from the laxative section at your drug store. You should not eat anything during the two hours prior to the procedure. You may take regular medications with small sips of water at least 2 hours prior to the study.  Anorectal manometry is a test performed to evaluate patients with constipation or fecal incontinence. This test measures the pressures of the anal sphincter muscles, the sensation in the rectum, and the neural reflexes that are needed for normal bowel movements.  THE PROCEDURE The test takes approximately 30 minutes to 1 hour. You will be asked to change into a hospital gown. A technician or nurse will explain the procedure to you, take a brief health history, and answer any questions you may have. The patient then lies on his or her left side. A small, flexible tube, about the size of a thermometer, with a balloon at the end is inserted into the rectum. The catheter is connected to a machine that measures the pressure. During the test, the small balloon attached to the catheter may be inflated in the rectum to assess the normal reflex pathways. The nurse or technician may also ask the person to squeeze, relax, and push at various times. The anal sphincter muscle pressures are measured during each of these maneuvers. To squeeze, the patient tightens the sphincter muscles as if trying to prevent anything from coming out. To push or bear down, the patient strains down as if trying to have a bowel movement.   If you are age 30 or younger, your body mass index should be between 19-25. Your Body mass index is 29.12 kg/m. If this is out of the aformentioned  range listed, please consider follow up with your Primary Care Provider.   Thank you for choosing me and [facility].  Dr. Rush Landmark

## 2020-08-04 ENCOUNTER — Encounter: Payer: Self-pay | Admitting: Gastroenterology

## 2020-08-04 NOTE — Telephone Encounter (Signed)
The pt's mother states that the pt's symptoms are unchanged.   The pt's mother has been advised that the pt should keep his appt for July with Dr Rush Landmark for procedure.  Dr Rush Landmark will make further recommendations after the procedure. The pt has been advised of the information and verbalized understanding.

## 2020-09-24 ENCOUNTER — Encounter: Payer: Self-pay | Admitting: Gastroenterology

## 2020-09-24 ENCOUNTER — Other Ambulatory Visit: Payer: Self-pay

## 2020-09-24 ENCOUNTER — Ambulatory Visit (AMBULATORY_SURGERY_CENTER): Payer: Medicaid Other | Admitting: Gastroenterology

## 2020-09-24 VITALS — BP 101/54 | HR 73 | Temp 98.4°F | Resp 20 | Ht 72.0 in | Wt 212.0 lb

## 2020-09-24 DIAGNOSIS — D128 Benign neoplasm of rectum: Secondary | ICD-10-CM

## 2020-09-24 DIAGNOSIS — R151 Fecal smearing: Secondary | ICD-10-CM | POA: Diagnosis not present

## 2020-09-24 DIAGNOSIS — R159 Full incontinence of feces: Secondary | ICD-10-CM

## 2020-09-24 DIAGNOSIS — K621 Rectal polyp: Secondary | ICD-10-CM

## 2020-09-24 DIAGNOSIS — K641 Second degree hemorrhoids: Secondary | ICD-10-CM

## 2020-09-24 DIAGNOSIS — R143 Flatulence: Secondary | ICD-10-CM

## 2020-09-24 DIAGNOSIS — K6289 Other specified diseases of anus and rectum: Secondary | ICD-10-CM

## 2020-09-24 MED ORDER — SODIUM CHLORIDE 0.9 % IV SOLN: 500.0000 mL | Freq: Once | INTRAVENOUS | Status: DC

## 2020-09-24 NOTE — Op Note (Signed)
[place] Patient Name: Carrol Bondar Procedure Date: 09/24/2020 10:54 AM MRN: [id number] Endoscopist: Justice Britain , MD Age: 35 Referring MD:  Date of Birth: 22-Sep-1985 Gender: Male Account #: [id number] Procedure:                Colonoscopy Indications:              Change in bowel habits, Fecal incontinence, Fecal                            smearing Medicines:                Monitored Anesthesia Care Procedure:                Pre-Anesthesia Assessment:                           - Prior to the procedure, a History and Physical                            was performed, and patient medications and                            allergies were reviewed. The patient's tolerance of                            previous anesthesia was also reviewed. The risks                            and benefits of the procedure and the sedation                            options and risks were discussed with the patient.                            All questions were answered, and informed consent                            was obtained. Prior Anticoagulants: The patient has                            taken no previous anticoagulant or antiplatelet                            agents. ASA Grade Assessment: II - A patient with                            mild systemic disease. After reviewing the risks                            and benefits, the patient was deemed in                            satisfactory condition to undergo the procedure.  After obtaining informed consent, the colonoscope                            was passed under direct vision. Throughout the                            procedure, the patient's blood pressure, pulse, and                            oxygen saturations were monitored continuously. The                            CF [id number] #[id number] was introduced through the anus                            and advanced to the the cecum, identified by                             appendiceal orifice and ileocecal valve. The                            colonoscopy was performed without difficulty. The                            patient tolerated the procedure. The quality of the                            bowel preparation was adequate. The ileocecal                            valve, appendiceal orifice, and rectum were                            photographed. Scope In: 11:15:47 AM Scope Out: 11:27:22 AM Scope Withdrawal Time: 0 hours 8 minutes 48 seconds  Total Procedure Duration: 0 hours 11 minutes 35 seconds  Findings:                 The digital rectal exam findings include                            hemorrhoids. Pertinent negatives include no                            palpable rectal lesions.                           A 2 mm polyp was found in the rectum. The polyp was                            sessile. The polyp was removed with a cold biopsy                            forceps. Resection and retrieval were complete.  Normal mucosa was found in the entire colon                            otherwise. Biopsies were taken with a cold forceps                            for histology randomly throughout colon to rule out                            chronic colitis. Biopsies were taken with a cold                            forceps for histology from the rectum to rule out                            chronic proctitis.                           Non-bleeding non-thrombosed internal hemorrhoids                            were found during retroflexion, during perianal                            exam and during digital exam. The hemorrhoids were                            Grade II (internal hemorrhoids that prolapse but                            reduce spontaneously). Complications:            No immediate complications. Estimated Blood Loss:     Estimated blood loss was minimal. Impression:               - Hemorrhoids  found on digital rectal exam.                           - One 2 mm polyp in the rectum, removed with a cold                            biopsy forceps. Resected and retrieved.                           - Normal mucosa in the entire examined colon.                            Biopsied as above.                           - Non-bleeding non-thrombosed internal hemorrhoids. Recommendation:           - The patient will be observed post-procedure,                            until  all discharge criteria are met.                           - Discharge patient to home.                           - Patient has a contact number available for                            emergencies. The signs and symptoms of potential                            delayed complications were discussed with the                            patient. Return to normal activities tomorrow.                            Written discharge instructions were provided to the                            patient.                           - High fiber diet.                           - Continue bulking fiber Benefiber or Metamucil.                           - If issues persist would consider Anorectal                            Manometry +/- Pelvic Floor Biofeedback therapy to                            try and strengthen muscles of defecation.                           - Await pathology results.                           - Repeat colonoscopy date to be determined after                            pending pathology results are reviewed but likely                            for typical colon cancer screening at age 37.                           - The findings and recommendations were discussed                            with the patient.                           -  The findings and recommendations were discussed                            with the patient's family. Justice Britain, MD 09/24/2020 11:37:55 AM

## 2020-09-24 NOTE — Patient Instructions (Signed)
Please read handouts provided. Continue present medications. Await pathology results. High Fiber Diet. Continue bulking fiber Benefiber or Metamucil.   YOU HAD AN ENDOSCOPIC PROCEDURE TODAY AT [facility]:   Refer to the procedure report that was given to you for any specific questions about what was found during the examination.  If the procedure report does not answer your questions, please call your gastroenterologist to clarify.  If you requested that your care partner not be given the details of your procedure findings, then the procedure report has been included in a sealed envelope for you to review at your convenience later.  YOU SHOULD EXPECT: Some feelings of bloating in the abdomen. Passage of more gas than usual.  Walking can help get rid of the air that was put into your GI tract during the procedure and reduce the bloating. If you had a lower endoscopy (such as a colonoscopy or flexible sigmoidoscopy) you may notice spotting of blood in your stool or on the toilet paper. If you underwent a bowel prep for your procedure, you may not have a normal bowel movement for a few days.  Please Note:  You might notice some irritation and congestion in your nose or some drainage.  This is from the oxygen used during your procedure.  There is no need for concern and it should clear up in a day or so.  SYMPTOMS TO REPORT IMMEDIATELY:  Following lower endoscopy (colonoscopy or flexible sigmoidoscopy):  Excessive amounts of blood in the stool  Significant tenderness or worsening of abdominal pains  Swelling of the abdomen that is new, acute  Fever of 100F or higher   For urgent or emergent issues, a gastroenterologist can be reached at any hour by calling [phone]. Do not use MyChart messaging for urgent concerns.    DIET:  We do recommend a small meal at first, but then you may proceed to your regular diet.  Drink plenty of fluids but you should avoid alcoholic  beverages for 24 hours.  ACTIVITY:  You should plan to take it easy for the rest of today and you should NOT DRIVE or use heavy machinery until tomorrow (because of the sedation medicines used during the test).    FOLLOW UP: Our staff will call the number listed on your records 48-72 hours following your procedure to check on you and address any questions or concerns that you may have regarding the information given to you following your procedure. If we do not reach you, we will leave a message.  We will attempt to reach you two times.  During this call, we will ask if you have developed any symptoms of COVID 19. If you develop any symptoms (ie: fever, flu-like symptoms, shortness of breath, cough etc.) before then, please call [phone].  If you test positive for Covid 19 in the 2 weeks post procedure, please call and report this information to [place].    If any biopsies were taken you will be contacted by phone or by letter within the next 1-3 weeks.  Please call us at [phone] if you have not heard about the biopsies in 3 weeks.    SIGNATURES/CONFIDENTIALITY: You and/or your care partner have signed paperwork which will be entered into your electronic medical record.  These signatures attest to the fact that that the information above on your After Visit Summary has been reviewed and is understood.  Full responsibility of the confidentiality of this discharge information lies with you and/or  your care-partner.

## 2020-09-24 NOTE — Progress Notes (Signed)
Called to room to assist during endoscopic procedure.  Patient ID and intended procedure confirmed with present staff. Received instructions for my participation in the procedure from the performing physician.  

## 2020-09-24 NOTE — Progress Notes (Signed)
A and O x3. Report to RN. Tolerated MAC anesthesia well. 

## 2020-09-24 NOTE — Progress Notes (Signed)
Medical history reviewed with no changes noted. VS assessed by C.W 

## 2020-09-28 ENCOUNTER — Encounter: Payer: Self-pay | Admitting: Gastroenterology

## 2020-09-28 ENCOUNTER — Telehealth: Payer: Self-pay

## 2020-09-28 NOTE — Telephone Encounter (Signed)
  Follow up Call-  Call back number 09/24/2020  Post procedure Call Back phone  # [phone]  Permission to leave phone message Yes  Some recent data might be hidden     Patient questions:  Do you have a fever, pain , or abdominal swelling? No. Pain Score  0 *  Have you tolerated food without any problems? Yes   Have you been able to return to your normal activities? Yes.    Do you have any questions about your discharge instructions: Diet   No. Medications  No. Follow up visit  No.  Do you have questions or concerns about your Care? No.  Actions: * If pain score is 4 or above: No action needed, pain <4.

## 2020-10-04 ENCOUNTER — Encounter (HOSPITAL_COMMUNITY): Admission: RE | Payer: Self-pay | Source: Home / Self Care

## 2020-10-04 ENCOUNTER — Ambulatory Visit (HOSPITAL_COMMUNITY): Admission: RE | Admit: 2020-10-04 | Payer: Medicaid Other | Source: Home / Self Care | Admitting: Gastroenterology

## 2020-10-04 SURGERY — MANOMETRY, ANORECTAL
Anesthesia: Monitor Anesthesia Care

## 2020-12-23 ENCOUNTER — Other Ambulatory Visit: Payer: Self-pay

## 2020-12-23 ENCOUNTER — Ambulatory Visit (INDEPENDENT_AMBULATORY_CARE_PROVIDER_SITE_OTHER): Payer: Medicaid Other | Admitting: Orthopaedic Surgery

## 2020-12-23 ENCOUNTER — Encounter: Payer: Self-pay | Admitting: Orthopaedic Surgery

## 2020-12-23 DIAGNOSIS — G802 Spastic hemiplegic cerebral palsy: Secondary | ICD-10-CM

## 2020-12-23 DIAGNOSIS — G801 Spastic diplegic cerebral palsy: Secondary | ICD-10-CM

## 2020-12-23 DIAGNOSIS — G809 Cerebral palsy, unspecified: Secondary | ICD-10-CM | POA: Insufficient documentation

## 2020-12-23 NOTE — Progress Notes (Signed)
Office Visit Note   Patient: Gregory Brock           Date of Birth: 06/08/1985           MRN: [id number] Visit Date: 12/23/2020              Requested by: Bernerd Limbo, MD [address] [place] [address] [place],  [place] [id number] PCP: Bernerd Limbo, MD   Assessment & Plan: Visit Diagnoses:  1. Spastic hemiplegic cerebral palsy (HCC)   2. 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  He does have a history of spastic diplegia and cerebral palsy involving his left upper and lower extremity.  He presently drives a car and does not have any problems with his right side.  I think before I sign off on the CDL I like physical therapy to evaluate him.  He might need to be referred to the neuro physical therapy department we will check into the appropriate referral  Follow-Up Instructions: Return Will refer to neuro rehab for evaluation of physical capacity related to a CDL license.   Orders:  Orders Placed This Encounter  Procedures   Ambulatory referral to Physical Therapy   No orders of the defined types were placed in this encounter.     Procedures: No procedures performed   Clinical Data: No additional findings.   Subjective: Chief Complaint  Patient presents with   Left Leg - Pain   Right Leg - Pain  Patient presents today to see Gregory Brock. He has cerebral palsy and is trying to get his CDLs. He has a form that needs to be filled out in order to be able to obtain this certification.   HPI  Review of Systems   Objective: Vital Signs: There were no vitals taken for this visit.  Physical Exam  [facility] exam today but does have cerebral palsy involvement of the left upper extremity.  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  I will perform a more detailed evaluation after he has been seen by physical  therapy  Specialty Comments:  No specialty comments available.  Imaging: No results found.   PMFS History: Patient Active Problem List   Diagnosis Date Noted   Cerebral palsy ([place]) 12/23/2020   Incontinence of feces 06/28/2020   Fecal smearing 06/28/2020   Flatus 06/28/2020   Past Medical History:  Diagnosis Date   Cerebral palsy ([place])     Family History  Problem Relation Age of Onset   Hypertension Mother    Hypertension Maternal Grandmother    Hypertension Maternal Grandfather    Prostate cancer Maternal Grandfather    Heart attack Maternal Grandfather    Cancer Neg Hx    Hyperlipidemia Neg Hx    Heart failure Neg Hx    Colon cancer Neg Hx    Esophageal cancer Neg Hx    Inflammatory bowel disease Neg Hx    Liver disease Neg Hx    Pancreatic cancer Neg Hx    Rectal cancer Neg Hx    Stomach cancer Neg Hx     Past Surgical History:  Procedure Laterality Date   DG CALCANEOUS HEEL LEFT ([facility] HX) Left    HAND SURGERY Left    thumb   SALIVARY GLAND SURGERY     Social History  Occupational History   Occupation: [employer]  Tobacco Use   Smoking status: Never   Smokeless tobacco: Never  Vaping Use   Vaping Use: Never used  Substance and Sexual Activity   Alcohol use: No   Drug use: No   Sexual activity: Not on file

## 2020-12-29 ENCOUNTER — Ambulatory Visit: Payer: Medicaid Other

## 2020-12-31 ENCOUNTER — Telehealth: Payer: Self-pay | Admitting: Orthopaedic Surgery

## 2020-12-31 NOTE — Telephone Encounter (Signed)
Pt called requesting a call back from [employer]. Pt states he need medical advice. Pt phone number is [phone].

## 2020-12-31 NOTE — Telephone Encounter (Signed)
Spoke with patient. He said that three different therapy locations have denied his visit because they cannot evaluate him for his CP. I will speak with Dr.Whitfield to get answers on whom he needs to see.

## 2021-01-12 ENCOUNTER — Ambulatory Visit: Payer: Medicaid Other | Admitting: Orthopaedic Surgery
